# Patient Record
Sex: Male | Born: 1990 | Race: Black or African American | Hispanic: No | Marital: Single | Smoking: Former smoker
Health system: Southern US, Community
[De-identification: ages and names within clinical notes are randomized; demographics above are authoritative.]

## PROBLEM LIST (undated history)

## (undated) DIAGNOSIS — N390 Urinary tract infection, site not specified: Secondary | ICD-10-CM

## (undated) DIAGNOSIS — A749 Chlamydial infection, unspecified: Secondary | ICD-10-CM

---
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
## 2011-05-29 ENCOUNTER — Inpatient Hospital Stay (INDEPENDENT_AMBULATORY_CARE_PROVIDER_SITE_OTHER)
Admission: RE | Admit: 2011-05-29 | Discharge: 2011-05-29 | Disposition: A | Discharge status: Home / Self Care | Source: Ambulatory Visit | Attending: Emergency Medicine | Admitting: Emergency Medicine

## 2011-05-29 DIAGNOSIS — Z0489 Encounter for examination and observation for other specified reasons: Secondary | ICD-10-CM

## 2011-05-29 LAB — HIV ANTIBODY (ROUTINE TESTING W REFLEX): HIV: NONREACTIVE

## 2011-05-30 LAB — GC/CHLAMYDIA PROBE AMP, GENITAL: Chlamydia, DNA Probe: NEGATIVE

## 2011-09-27 ENCOUNTER — Encounter (HOSPITAL_COMMUNITY): Payer: Self-pay | Admitting: *Deleted

## 2011-09-27 ENCOUNTER — Emergency Department (INDEPENDENT_AMBULATORY_CARE_PROVIDER_SITE_OTHER)
Admission: EM | Admit: 2011-09-27 | Discharge: 2011-09-27 | Disposition: A | Discharge status: Home / Self Care | Source: Home / Self Care | Attending: Emergency Medicine | Admitting: Emergency Medicine

## 2011-09-27 DIAGNOSIS — N390 Urinary tract infection, site not specified: Secondary | ICD-10-CM

## 2011-09-27 LAB — POCT URINALYSIS DIP (DEVICE)
Bilirubin Urine: NEGATIVE
Glucose, UA: NEGATIVE mg/dL
Ketones, ur: NEGATIVE mg/dL
Leukocytes, UA: NEGATIVE
pH: 8.5 — ABNORMAL HIGH (ref 5.0–8.0)

## 2011-09-27 MED ORDER — CEPHALEXIN 500 MG PO CAPS: 500.0000 mg | ORAL_CAPSULE | Freq: Three times a day (TID) | ORAL | Status: AC

## 2011-09-27 NOTE — ED Provider Notes (Signed)
History     CSN: [id number] Arrival date & time: 09/27/2011  3:43 PM   First MD Initiated Contact with Patient 09/27/11 1609      Chief Complaint  Patient presents with  . Urinary Tract Infection    (Consider location/radiation/quality/duration/timing/severity/associated sxs/prior treatment) HPI Comments: Tyler Santiago has had a four-day history of burning with urination, urinary frequency, urgency, and small amounts of blood in the urine. He denies any discharge or lesions on the penis. He's had no fever, chills, or sweats. He denies any lower back pain, abdominal pain, nausea, or vomiting. He has no prior history of urinary tract infections. He has no history of STDs in the past. He is sexually active with a single male sexual partner and their last intercourse was a week ago. He states usually uses condoms but on this one occasion he did not. His girlfriend doesn't have any symptoms or any history of any STDs. He was here last August and had STD testing done. He states he did not have any symptoms at that time. All the tests were negative.  Patient is a 20 y.o. male presenting with urinary tract infection.  Urinary Tract Infection Pertinent negatives include no abdominal pain.    History reviewed. No pertinent past medical history.  History reviewed. No pertinent past surgical history.  History reviewed. No pertinent family history.  History  Substance Use Topics  . Smoking status: Not on file  . Smokeless tobacco: Not on file  . Alcohol Use: Not on file      Review of Systems  Constitutional: Negative for fever and chills.  Gastrointestinal: Negative for nausea, vomiting and abdominal pain.  Genitourinary: Positive for dysuria, urgency and frequency. Negative for hematuria, flank pain, discharge, penile swelling, scrotal swelling, genital sores, penile pain and testicular pain.    Allergies  Review of patient's allergies indicates no known allergies.  Home Medications    Current Outpatient Rx  Name Route Sig Dispense Refill  . CEPHALEXIN 500 MG PO CAPS Oral Take 1 capsule (500 mg total) by mouth 3 (three) times daily. 30 capsule 0    BP 124/71  Pulse 62  Temp(Src) 98.1 F (36.7 C) (Oral)  Resp 18  SpO2 98%  Physical Exam  Nursing note and vitals reviewed. Constitutional: He appears well-developed and well-nourished. No distress.  Cardiovascular: Normal rate and regular rhythm.  Exam reveals no gallop and no friction rub.   No murmur heard. Pulmonary/Chest: Effort normal. No respiratory distress. He has no wheezes. He has no rales.  Abdominal: Soft. Bowel sounds are normal. He exhibits no distension and no mass. There is no hepatosplenomegaly. There is no tenderness. There is no rebound, no guarding and no CVA tenderness.  Genitourinary: Penis normal. No penile tenderness.       Genital exam was negative. There was no urethral discharge, no lesions on the penis. Testes are normal. No inguinal adenopathy, no hernia.  Skin: He is not diaphoretic.    ED Course  Procedures (including critical care time)  Results for orders placed during the hospital encounter of 09/27/11  POCT URINALYSIS DIP (DEVICE)      Component Value Range   Glucose, UA NEGATIVE  NEGATIVE (mg/dL)   Bilirubin Urine NEGATIVE  NEGATIVE    Ketones, ur NEGATIVE  NEGATIVE (mg/dL)   Specific Gravity, Urine 1.020  1.005 - 1.030    Hgb urine dipstick TRACE (*) NEGATIVE    pH 8.5 (*) 5.0 - 8.0    Protein, ur 100 (*) NEGATIVE (  mg/dL)   Urobilinogen, UA 1.0  0.0 - 1.0 (mg/dL)   Nitrite POSITIVE (*) NEGATIVE    Leukocytes, UA NEGATIVE  NEGATIVE      Labs Reviewed  POCT URINALYSIS DIP (DEVICE) - Abnormal; Notable for the following:    Hgb urine dipstick TRACE (*)    pH 8.5 (*)    Protein, ur 100 (*)    Nitrite POSITIVE (*)    All other components within normal limits  POCT URINALYSIS DIPSTICK  GC/CHLAMYDIA PROBE AMP, GENITAL  URINE CULTURE   No results found.   1. UTI  (lower urinary tract infection)       MDM  He appears to had a urinary tract infection which is not all that common in males. Right now there isn't any sign of the urethritis. We have cultures pending of his urine and a urethral GC chlamydia probe. Will go ahead and start him on cephalexin and I told him we would call him back if anything other than a routine urinary tract infection showed up.        Roque Lias, MD 09/27/11 1726

## 2011-09-27 NOTE — ED Notes (Signed)
Pt c/o burning upon urination x 3 days. Reports taking AZO & drinking cranberry juice w/ minimal relief. Admits to recently engaging in unprotected sex w/ his partner.

## 2011-09-29 LAB — URINE CULTURE: Culture: NO GROWTH

## 2011-10-01 ENCOUNTER — Telehealth (HOSPITAL_COMMUNITY): Payer: Self-pay | Admitting: Emergency Medicine

## 2011-10-01 LAB — GC/CHLAMYDIA PROBE AMP, GENITAL: Chlamydia, DNA Probe: POSITIVE — AB

## 2011-10-01 MED ORDER — AZITHROMYCIN 250 MG PO TABS: 1000.0000 mg | ORAL_TABLET | Freq: Once | ORAL | Status: AC

## 2011-10-01 NOTE — ED Notes (Signed)
Labs and medications reviewed.  Needs tx. for Chlamydia. Message sent to Dr. Ladon Applebaum and order for Zithromax e- prescribed to [facility] [address].  DHHS form completed and faxed to the [facility]. The patient was notified- see telephone note. Vassie Moselle 10/01/2011

## 2012-03-13 ENCOUNTER — Emergency Department (INDEPENDENT_AMBULATORY_CARE_PROVIDER_SITE_OTHER)
Admission: EM | Admit: 2012-03-13 | Discharge: 2012-03-13 | Disposition: A | Discharge status: Home / Self Care | Payer: Self-pay | Source: Home / Self Care | Attending: Emergency Medicine | Admitting: Emergency Medicine

## 2012-03-13 ENCOUNTER — Encounter (HOSPITAL_COMMUNITY): Payer: Self-pay | Admitting: Emergency Medicine

## 2012-03-13 DIAGNOSIS — R1013 Epigastric pain: Secondary | ICD-10-CM

## 2012-03-13 HISTORY — DX: Chlamydial infection, unspecified: A74.9

## 2012-03-13 HISTORY — DX: Urinary tract infection, site not specified: N39.0

## 2012-03-13 LAB — POCT URINALYSIS DIP (DEVICE)
Ketones, ur: NEGATIVE mg/dL
Protein, ur: 30 mg/dL — AB
Specific Gravity, Urine: 1.015 (ref 1.005–1.030)
pH: 7.5 (ref 5.0–8.0)

## 2012-03-13 MED ORDER — GI COCKTAIL ~~LOC~~
30.0000 mL | Freq: Once | ORAL | Status: AC
  Administered 2012-03-13: 30 mL via ORAL

## 2012-03-13 MED ORDER — DICYCLOMINE HCL 20 MG PO TABS: 20.0000 mg | ORAL_TABLET | Freq: Four times a day (QID) | ORAL | Status: DC | PRN

## 2012-03-13 MED ORDER — GI COCKTAIL ~~LOC~~
ORAL | Status: AC
  Filled 2012-03-13: qty 30

## 2012-03-13 MED ORDER — ONDANSETRON 4 MG PO TBDP: 4.0000 mg | ORAL_TABLET | Freq: Once | ORAL | Status: DC

## 2012-03-13 MED ORDER — ONDANSETRON 8 MG PO TBDP: ORAL_TABLET | ORAL | Status: AC

## 2012-03-13 NOTE — Discharge Instructions (Signed)
Clear liquid diet until you feel better. Return here tomorrow if you're not feeling better for a repeat abdominal exam. Return to the ER immediately if your pain changes in character, quality, location, intensity, if you have persistent vomiting even with Zofran, blood in your stool, multiple episodes of diarrhea, or any other concerns.  Go to www.goodrx.com to look up your medications. This will give you a list of where you can find your prescriptions at the most affordable prices.   Call [facility]  [phone]  If you have no primary doctor, here are some resources that may be helpful:  Medicaid-accepting [facility] Providers:   - [facility]- [address]      [phone]      Mon-Fri 9am-7pm, Sat 9am-1pm   - [facility]- [address] [place], [place]      [phone]   - [facility]- [address] [place]      [phone]   [facility]- [address]      [phone]   - Renaye Rakers- [address] [place], [address]      [phone]      Only accepts [place] patients       after they have her name applied to their card   Self Pay (no insurance) in [place]:   - Sickle Cell Patients: Dr Willey Blade, [facility]      [address] [place]      [phone]   - [phone]   - Physician Referral Service- [phone]   - [facility]- [address] [place]      [phone]   [facility] [place]- [address]   - [facility]- see information above      (Speak to [employer] if you do not have insurance)   - [facility]- [address] [place]      [phone]   - [facility] [place]- [address]      [phone]   - [facility]- [address]      [phone]   - Dr Julio Sicks-  [address], [place]      [phone]   - [facility]- [address]      [phone]   - [facility]- [address]      [phone]      Also [address]      [phone]   - [facility]- [address]      [phone]      1st and 3rd Saturday every month, 10am-1pm    Other agencies that provide inexpensive medical care:     [facility]  [phone]    [facility]  [phone]    [facility]  [phone]    [facility]  [phone] [address] [place] [address]    [facility]  [phone]    [facility]  [phone] [facility] [phone]   [address] [place], [place] [address]  Chronic Pain Problems Contact [facility]  [phone] Patients need to be referred by their primary care doctor.  [facility] [place]  [facility] Dept. [address] [place]                       [address]      [address] [place] [address]   [phone] (After Hours)  General Information: Finding a doctor when you do not have health insurance can be tricky. Although you are not limited by an insurance plan, you are of course limited by her finances and how much but he can pay out of pocket.  What are your options if you don't have health insurance?   1) Find a Librarian, academic and Pay Out of Pocket Although you won't have to find out who is covered by your insurance plan, it is a good idea to ask around and get recommendations. You will then need to call the office and see if the doctor you have chosen will accept you as a new patient and what types of options they offer for patients who are self-pay. Some doctors offer discounts or will set up payment plans for their patients who do not have insurance, but you will need to ask so you aren't surprised when you get to your appointment.  2) Contact Your Local Health  Department Not all health departments have doctors that can see patients for sick visits, but many do, so it is worth a call to see if yours does. If you don't know where your local health department is, you can check in your phone book. The CDC also has a tool to help you locate your state's health department, and many state websites also have listings of all of their local health departments.  3) Find a Walk-in Clinic If your illness is not likely to be very severe or complicated, you may want to try a walk in clinic. These are popping up all over the country in pharmacies, drugstores, and shopping centers. They're usually staffed by nurse practitioners or physician assistants that have been trained to treat common illnesses and complaints. They're usually fairly quick and inexpensive. However, if you have serious medical issues or chronic medical problems, these are probably not your best option

## 2012-03-13 NOTE — ED Notes (Signed)
Pt having severe stomach pain starting this morning, he states it feels like it is twisting. Pt states he vomited one time today. No nausea or vomiting. Pt states he had a big mac and LOTS of popcorn last night.

## 2012-03-13 NOTE — ED Notes (Signed)
Patient could not void at time of order placement

## 2012-03-13 NOTE — ED Provider Notes (Signed)
History     CSN: [id number]  Arrival date & time 03/13/12  1136   First MD Initiated Contact with Patient 03/13/12 1204      Chief Complaint  Patient presents with  . Abdominal Pain    (Consider location/radiation/quality/duration/timing/severity/associated sxs/prior treatment) HPI Comments: Patient reports eating a big Mac and a large tub of popcorn last night. Denies alcohol intake. Reports sharp, "twisting" periumbilical abdominal pain that lasts less than a minute and then resolves starting 12 hours later. States he rarely eats greasy food. No right upper quadrant pain. Vomited once. States he felt better after vomiting. No fevers, abdominal distention, hematochezia, melena. last bowel movement was yesterday, was within normal limits for him. No urinary complaints. No penile discharge. Patient is sexually active with male partner who is asymptomatic. No  history of  appendicitis, abdominal surgeries, pancreatitis, nephrolithiasis.  ROS as noted in HPI. All other ROS negative.   Patient is a 21 y.o. male presenting with abdominal pain. The history is provided by the patient. No language interpreter was used.  Abdominal Pain The primary symptoms of the illness include abdominal pain, nausea and vomiting. The primary symptoms of the illness do not include fever, diarrhea, hematemesis, hematochezia or dysuria. The current episode started 3 to 5 hours ago. The onset of the illness was sudden. The problem has been gradually improving.  The patient has not had a change in bowel habit. Symptoms associated with the illness do not include chills, anorexia, constipation, urgency, hematuria, frequency or back pain. Significant associated medical issues do not include PUD, inflammatory bowel disease, diabetes, gallstones, liver disease, substance abuse or diverticulitis.    History reviewed. No pertinent past medical history.  History reviewed. No pertinent past surgical history.  History  reviewed. No pertinent family history.  History  Substance Use Topics  . Smoking status: Not on file  . Smokeless tobacco: Not on file  . Alcohol Use: Not on file      Review of Systems  Constitutional: Negative for fever and chills.  Gastrointestinal: Positive for nausea, vomiting and abdominal pain. Negative for diarrhea, constipation, hematochezia, anorexia and hematemesis.  Genitourinary: Negative for dysuria, urgency, frequency and hematuria.  Musculoskeletal: Negative for back pain.    Allergies  Review of patient's allergies indicates no known allergies.  Home Medications  No current outpatient prescriptions on file.  BP 127/82  Pulse 75  Temp(Src) 98.1 F (36.7 C) (Oral)  Resp 18  SpO2 100%  Physical Exam  Nursing note and vitals reviewed. Constitutional: He is oriented to person, place, and time. He appears well-developed and well-nourished.  HENT:  Head: Normocephalic and atraumatic.  Eyes: Conjunctivae and EOM are normal.  Neck: Normal range of motion.  Cardiovascular: Normal rate, regular rhythm and normal heart sounds.   Pulmonary/Chest: Effort normal and breath sounds normal. No respiratory distress.  Abdominal: Soft. Normal appearance and bowel sounds are normal. He exhibits no distension and no mass. There is tenderness in the periumbilical area. There is no rigidity, no rebound, no guarding, no CVA tenderness, no tenderness at McBurney's point and negative Murphy's sign.       Mild periumbilical tenderness. No right upper quadrant tenderness.  Musculoskeletal: Normal range of motion. He exhibits no edema.  Neurological: He is alert and oriented to person, place, and time.  Skin: Skin is warm and dry. No rash noted.  Psychiatric: He has a normal mood and affect. His behavior is normal. Judgment and thought content normal.    ED Course  Procedures (including critical care time)  Labs Reviewed  POCT URINALYSIS DIP (DEVICE) - Abnormal; Notable for the  following:    Hgb urine dipstick TRACE (*)    Protein, ur 30 (*)    All other components within normal limits   No results found.   No diagnosis found.  Results for orders placed during the hospital encounter of 03/13/12  POCT URINALYSIS DIP (DEVICE)      Component Value Range   Glucose, UA NEGATIVE  NEGATIVE (mg/dL)   Bilirubin Urine NEGATIVE  NEGATIVE    Ketones, ur NEGATIVE  NEGATIVE (mg/dL)   Specific Gravity, Urine 1.015  1.005 - 1.030    Hgb urine dipstick TRACE (*) NEGATIVE    pH 7.5  5.0 - 8.0    Protein, ur 30 (*) NEGATIVE (mg/dL)   Urobilinogen, UA 0.2  0.0 - 1.0 (mg/dL)   Nitrite NEGATIVE  NEGATIVE    Leukocytes, UA NEGATIVE  NEGATIVE      MDM  Previous records reviewed. History of UTI, Chlamydia.    Pt abd exam is benign, no peritoneal signs. Mild periumbilical tenderness. Denies alcohol intake. Appears comfortable, tolerating by mouth here. Symptoms better after GI cocktail. Tolerating by mouth. Even if he has pancreatitis, he does not appear to be in a significant amount of pain or dehydrated at this time. No evidence of surgical abd. Doubt SBO, mesenteric ischemia, appendicitis, hepatitis, cholecystitis, pancreatitis, or perforated viscus. No evidence to suggest testicular source for abdominal pain.  Gave very specific return instructions. Will have him return here to our for repeat abdominal exam if he is not feeling significantly better. Home with Bentyl and Zofran. Also discussed hematuria. No urinary complaints today. He will need to have this  followup with a primary care physician of his choice. Providing list of resources.  Luiz Blare, MD 03/13/12 [phone]

## 2012-05-08 ENCOUNTER — Emergency Department (INDEPENDENT_AMBULATORY_CARE_PROVIDER_SITE_OTHER)
Admission: EM | Admit: 2012-05-08 | Discharge: 2012-05-08 | Disposition: A | Discharge status: Home / Self Care | Source: Home / Self Care | Attending: Emergency Medicine | Admitting: Emergency Medicine

## 2012-05-08 ENCOUNTER — Encounter (HOSPITAL_COMMUNITY): Payer: Self-pay | Admitting: *Deleted

## 2012-05-08 DIAGNOSIS — N342 Other urethritis: Secondary | ICD-10-CM

## 2012-05-08 LAB — POCT URINALYSIS DIP (DEVICE)
Protein, ur: NEGATIVE mg/dL
Urobilinogen, UA: 0.2 mg/dL (ref 0.0–1.0)
pH: 6 (ref 5.0–8.0)

## 2012-05-08 MED ORDER — AZITHROMYCIN 250 MG PO TABS
1000.0000 mg | ORAL_TABLET | Freq: Once | ORAL | Status: AC
  Administered 2012-05-08: 1000 mg via ORAL

## 2012-05-08 MED ORDER — CEPHALEXIN 500 MG PO CAPS: 500.0000 mg | ORAL_CAPSULE | Freq: Three times a day (TID) | ORAL | Status: AC

## 2012-05-08 MED ORDER — AZITHROMYCIN 250 MG PO TABS
ORAL_TABLET | ORAL | Status: AC
  Filled 2012-05-08: qty 4

## 2012-05-08 MED ORDER — CEFTRIAXONE SODIUM 250 MG IJ SOLR
250.0000 mg | Freq: Once | INTRAMUSCULAR | Status: AC
  Administered 2012-05-08: 250 mg via INTRAMUSCULAR

## 2012-05-08 MED ORDER — CEFTRIAXONE SODIUM 250 MG IJ SOLR
INTRAMUSCULAR | Status: AC
  Filled 2012-05-08: qty 250

## 2012-05-08 NOTE — ED Notes (Signed)
Pt  Reports  Symptoms  Of     Burning on  Urination        -     Pt  States           He  May  Have  An std         He  denys  Any   Other     Symptoms  Other than the  Aforementioned

## 2012-05-08 NOTE — ED Provider Notes (Addendum)
Chief Complaint  Patient presents with  . SEXUALLY TRANSMITTED DISEASE    History of Present Illness:   Tyler Santiago is a 21 year old male who has had a one-day history of burning with urination. He denies any discharge, frequency, urgency, or hematuria. He has had no lesions on the penis, no fever, chills, skin rash, joint pain, adenopathy, or testicular pain. He notes that his urinary stream is normal. He has had a prior history of Chlamydia.  Review of Systems:  Other than noted above, the patient denies any of the following symptoms: General:  No fevers, chills, sweats, aches, or fatigue. GI:  No abdominal pain, back pain, nausea, vomiting, diarrhea, or constipation. GU:  No dysuria, frequency, urgency, hematuria, urethral discharge, penile lesions, penile pain, testicular pain, swelling, or mass, inguinal lymphadenopathy or incontinence.  PMFSH:  Past medical history, family history, social history, meds, and allergies were reviewed.  Physical Exam:   Vital signs:  BP 143/77  Pulse 60  Temp 98.6 F (37 C) (Oral)  Resp 16  SpO2 100% Gen:  Alert, oriented, in no distress. Lungs:  Clear to auscultation, no wheezes, rales or rhonchi. Heart:  Regular rhythm, no gallop or murmer. Abdomen:  Flat and soft.  No tenderness to palpation, guarding, or rebound.  No hepato-splenomegaly or mass.  Bowel sounds were normally active.  No hernia. Genital exam:   normal genital exam. No urethral discharge, no lesions on the penis , no inguinal adenopathy, testes are normal without tenderness or masses. Back:  No CVA tenderness.  Skin:  Clear, warm and dry.  Labs:   Results for orders placed during the hospital encounter of 05/08/12  POCT URINALYSIS DIP (DEVICE)      Component Value Range   Glucose, UA NEGATIVE  NEGATIVE mg/dL   Bilirubin Urine NEGATIVE  NEGATIVE   Ketones, ur NEGATIVE  NEGATIVE mg/dL   Specific Gravity, Urine 1.025  1.005 - 1.030   Hgb urine dipstick SMALL (*) NEGATIVE   pH 6.0  5.0 -  8.0   Protein, ur NEGATIVE  NEGATIVE mg/dL   Urobilinogen, UA 0.2  0.0 - 1.0 mg/dL   Nitrite NEGATIVE  NEGATIVE   Leukocytes, UA TRACE (*) NEGATIVE    Other Labs Obtained at Urgent Care Center:  Urine culture was obtained as well as GC and Chlamydia DNA probe.  Results are pending at this time and we will call about any positive results.  Course in Urgent Care Center:   He was given Rocephin 250 mg IM and azithromycin 1000 mg by mouth and tolerated these well without any immediate side effects.  Assessment: The encounter diagnosis was Urethritis.  I think it's most likely that he has urethritis, but he did not have any discharge. I did go ahead and culture his urine and will treat with Rocephin and azithromycin. Cultures are pending.   Plan:   1.  The following meds were prescribed:   New Prescriptions   CEPHALEXIN (KEFLEX) 500 MG CAPSULE    Take 1 capsule (500 mg total) by mouth 3 (three) times daily.   2.  The patient was instructed in symptomatic care and handouts were given. 3.  The patient was told to return if becoming worse in any way, if no better in 3 or 4 days, and given some red flag symptoms that would indicate earlier return.     Reuben Likes, MD 05/08/12 2013   Patient still has urethral burning.  His DNA probe showed chlamydia, but he was treated.  His urine culture was negative.  He has not started his cephalexin.  Will treat with doxycycline 100 mg BID for 10 days and Metronidazole 500 mg BID for 1 week.  Reuben Likes, MD 05/12/12 [phone]

## 2012-05-09 LAB — URINE CULTURE
Colony Count: NO GROWTH
Culture: NO GROWTH

## 2012-05-11 LAB — GC/CHLAMYDIA PROBE AMP, GENITAL
Chlamydia, DNA Probe: POSITIVE — AB
GC Probe Amp, Genital: NEGATIVE

## 2012-05-12 ENCOUNTER — Telehealth (HOSPITAL_COMMUNITY): Payer: Self-pay | Admitting: *Deleted

## 2012-05-12 MED ORDER — DOXYCYCLINE HYCLATE 100 MG PO TABS: 100.0000 mg | ORAL_TABLET | Freq: Two times a day (BID) | ORAL | Status: AC

## 2012-05-12 MED ORDER — METRONIDAZOLE 500 MG PO TABS: 500.0000 mg | ORAL_TABLET | Freq: Two times a day (BID) | ORAL | Status: AC

## 2012-05-12 NOTE — ED Notes (Signed)
Pt. came to [facility] with questions about tx. I explained to pt. that the doctor had prescribed Doxycycline and Flagyl because he was still complaining about burning.  He said he was told the Zithromax he took here was for a urinary tract infection. I told him the Zithromax he took was for the Chlamydia. I told him his urine culture was negative. He does not have a UTI.  I said he may have a urethritis and there are many causes for this, STD's, UTI's non-gonococcal urethritis.  He is giving the Flagyl in case it is trich, which he was not tested for but can give these symptoms. I explained this 3 times, 3 ways and pt. Does not understand what I am saying. He asked to speak to the doctor. I asked Dr. Lorenz Coaster to speak to him and he did. Vassie Moselle 05/12/2012

## 2012-05-12 NOTE — ED Notes (Signed)
Pt. called in for his lab results. I told him I tried to call him earlier today x 2.  Pt. verified x 2 and given results.  Pt. told he was adequately treated with the Zithromax.  Pt. instructed to notify his partner, no sex for 1 week and to practice safe sex. Pt. told he can get HIV testing at the [facility]. STD clinic.  Pt. states he is still having burning. I told him it has been 4 days and he should have gotten some relief. Pt. denies discharge. Pt. states he did not get the Rx. of Keflex. I told him the urine culture was neg., so it is not a UTI. I told him I would ask Dr. Lorenz Coaster.  Dr. Lorenz Coaster thinks he would benefit from Doxycycline and Flagyl. He will e- prescribe them to his pharmacy.  When I got back to the phone, pt. had hung up.  I called pt. back and explained this to him.   Pt. instructed to no alcohol while taking the Flagyl.  Pt. Instructed to take medication with food because they could make him nauseated. Pt. wants Rx. sent to the [facility] on [address]. He asked me to call when the have been sent. Vassie Moselle 05/12/2012

## 2012-05-12 NOTE — ED Notes (Signed)
GC neg., Chlamydia pos.  Pt. adequately treated with Zithromax.  I called pt. but he was unavailable- unable to leave a message. I called his home # and it was not in service.  DHHS form completed and faxed to the [facility]. Vassie Moselle 05/12/2012

## 2012-10-09 ENCOUNTER — Emergency Department (HOSPITAL_BASED_OUTPATIENT_CLINIC_OR_DEPARTMENT_OTHER)

## 2012-10-09 ENCOUNTER — Encounter (HOSPITAL_BASED_OUTPATIENT_CLINIC_OR_DEPARTMENT_OTHER): Payer: Self-pay | Admitting: *Deleted

## 2012-10-09 ENCOUNTER — Emergency Department (HOSPITAL_BASED_OUTPATIENT_CLINIC_OR_DEPARTMENT_OTHER)
Admission: EM | Admit: 2012-10-09 | Discharge: 2012-10-09 | Disposition: A | Discharge status: Home / Self Care | Attending: Emergency Medicine | Admitting: Emergency Medicine

## 2012-10-09 DIAGNOSIS — S022XXA Fracture of nasal bones, initial encounter for closed fracture: Secondary | ICD-10-CM

## 2012-10-09 DIAGNOSIS — F17200 Nicotine dependence, unspecified, uncomplicated: Secondary | ICD-10-CM | POA: Insufficient documentation

## 2012-10-09 DIAGNOSIS — S0003XA Contusion of scalp, initial encounter: Secondary | ICD-10-CM | POA: Insufficient documentation

## 2012-10-09 DIAGNOSIS — Z8619 Personal history of other infectious and parasitic diseases: Secondary | ICD-10-CM | POA: Insufficient documentation

## 2012-10-09 DIAGNOSIS — T148XXA Other injury of unspecified body region, initial encounter: Secondary | ICD-10-CM

## 2012-10-09 DIAGNOSIS — N390 Urinary tract infection, site not specified: Secondary | ICD-10-CM | POA: Insufficient documentation

## 2012-10-09 DIAGNOSIS — S1093XA Contusion of unspecified part of neck, initial encounter: Secondary | ICD-10-CM | POA: Insufficient documentation

## 2012-10-09 MED ORDER — HYDROCODONE-ACETAMINOPHEN 5-325 MG PO TABS: 1.0000 | ORAL_TABLET | Freq: Four times a day (QID) | ORAL | Status: DC | PRN
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
## 2012-11-11 ENCOUNTER — Emergency Department (INDEPENDENT_AMBULATORY_CARE_PROVIDER_SITE_OTHER)
Admission: EM | Admit: 2012-11-11 | Discharge: 2012-11-11 | Disposition: A | Discharge status: Home / Self Care | Source: Home / Self Care | Attending: Emergency Medicine | Admitting: Emergency Medicine

## 2012-11-11 ENCOUNTER — Encounter (HOSPITAL_COMMUNITY): Payer: Self-pay | Admitting: *Deleted

## 2012-11-11 DIAGNOSIS — Z113 Encounter for screening for infections with a predominantly sexual mode of transmission: Secondary | ICD-10-CM | POA: Insufficient documentation

## 2012-11-11 DIAGNOSIS — Z2089 Contact with and (suspected) exposure to other communicable diseases: Secondary | ICD-10-CM

## 2012-11-11 DIAGNOSIS — Z202 Contact with and (suspected) exposure to infections with a predominantly sexual mode of transmission: Secondary | ICD-10-CM

## 2012-11-11 NOTE — ED Notes (Signed)
Pt request to be checked for std - denies s/s of any std

## 2012-11-13 ENCOUNTER — Other Ambulatory Visit (HOSPITAL_COMMUNITY)
Admission: RE | Admit: 2012-11-13 | Discharge: 2012-11-13 | Disposition: A | Discharge status: Home / Self Care | Payer: Self-pay | Source: Ambulatory Visit | Attending: Family Medicine | Admitting: Family Medicine

## 2012-11-15 MED ORDER — DOXYCYCLINE HYCLATE 100 MG PO CAPS: 100.0000 mg | ORAL_CAPSULE | Freq: Two times a day (BID) | ORAL | Status: DC
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
## 2012-11-16 ENCOUNTER — Telehealth (HOSPITAL_COMMUNITY): Payer: Self-pay | Admitting: *Deleted

## 2012-11-16 NOTE — ED Notes (Signed)
GC neg., Chlamydia pos., HIV/RPR non-reactive.  1/19  Dr. Tressia Danas did Rx. of Doxycycline . I called pt. and left a message to call. Tyler Santiago 11/16/2012

## 2012-11-17 ENCOUNTER — Telehealth (HOSPITAL_COMMUNITY): Payer: Self-pay | Admitting: *Deleted

## 2012-11-17 NOTE — ED Notes (Signed)
Pt. called back. Pt. verified x 2 and given results.  Pt. told he has a Rx. of Zithromax at the [facility] on [address].  Pt. instructed to notify his partner, no sex for 1 week and to practice safe sex. Pt. told he should get HIV rechecked in 6 mos. at the [facility] Dept. STD clinic. Pt. Voiced understanding.  DHHS form completed and faxed to the [facility]. Vassie Moselle 11/17/2012

## 2013-05-11 ENCOUNTER — Other Ambulatory Visit (HOSPITAL_COMMUNITY)
Admission: RE | Admit: 2013-05-11 | Discharge: 2013-05-11 | Disposition: A | Discharge status: Home / Self Care | Payer: Self-pay | Source: Ambulatory Visit | Attending: Emergency Medicine | Admitting: Emergency Medicine

## 2013-05-11 ENCOUNTER — Encounter (HOSPITAL_COMMUNITY): Payer: Self-pay | Admitting: Emergency Medicine

## 2013-05-11 ENCOUNTER — Emergency Department (INDEPENDENT_AMBULATORY_CARE_PROVIDER_SITE_OTHER)
Admission: EM | Admit: 2013-05-11 | Discharge: 2013-05-11 | Disposition: A | Discharge status: Home / Self Care | Payer: Self-pay | Source: Home / Self Care

## 2013-05-11 DIAGNOSIS — N342 Other urethritis: Secondary | ICD-10-CM

## 2013-05-11 DIAGNOSIS — Z113 Encounter for screening for infections with a predominantly sexual mode of transmission: Secondary | ICD-10-CM | POA: Insufficient documentation

## 2013-05-11 LAB — POCT URINALYSIS DIP (DEVICE)
Bilirubin Urine: NEGATIVE
Glucose, UA: NEGATIVE mg/dL
Ketones, ur: NEGATIVE mg/dL
Nitrite: NEGATIVE

## 2013-05-11 MED ORDER — CEFTRIAXONE SODIUM 250 MG IJ SOLR
250.0000 mg | Freq: Once | INTRAMUSCULAR | Status: AC
  Administered 2013-05-11: 250 mg via INTRAMUSCULAR

## 2013-05-11 MED ORDER — CEFTRIAXONE SODIUM 250 MG IJ SOLR
INTRAMUSCULAR | Status: AC
  Filled 2013-05-11: qty 250

## 2013-05-11 MED ORDER — AZITHROMYCIN 250 MG PO TABS
ORAL_TABLET | ORAL | Status: AC
  Filled 2013-05-11: qty 4

## 2013-05-11 MED ORDER — AZITHROMYCIN 250 MG PO TABS
1000.0000 mg | ORAL_TABLET | Freq: Every day | ORAL | Status: DC
  Administered 2013-05-11: 1000 mg via ORAL

## 2013-05-11 MED ORDER — LIDOCAINE HCL (PF) 1 % IJ SOLN
INTRAMUSCULAR | Status: AC
  Filled 2013-05-11: qty 5

## 2013-05-11 NOTE — ED Provider Notes (Signed)
History    CSN: [id number] Arrival date & time 05/11/13  1634  None    Chief Complaint  Patient presents with  . Exposure to STD   (Consider location/radiation/quality/duration/timing/severity/associated sxs/prior Treatment) HPI  22 yo bm presents today with complaint of dysuria x one week.  States that his partner is currently being treated for GC or Chlamydia.  Denies hematuria, penile discharge, fever, chills, abd/testicular pain.  Has been treated for std in the past.   Past Medical History  Diagnosis Date  . UTI (lower urinary tract infection)   . Chlamydia    History reviewed. No pertinent past surgical history. No family history on file. History  Substance Use Topics  . Smoking status: Current Some Day Smoker    Types: Cigars  . Smokeless tobacco: Not on file  . Alcohol Use: No    Review of Systems  Constitutional: Negative.   HENT: Negative.   Eyes: Negative.   Respiratory: Negative.   Cardiovascular: Negative.   Gastrointestinal: Negative.   Endocrine: Negative.   Genitourinary: Positive for dysuria and enuresis. Negative for urgency, frequency, hematuria, flank pain, decreased urine volume, discharge, penile swelling, scrotal swelling, difficulty urinating, genital sores, penile pain and testicular pain.  Musculoskeletal: Negative.   Skin: Negative.   Neurological: Negative.   Hematological: Negative.   Psychiatric/Behavioral: Negative.     Allergies  Review of patient's allergies indicates no known allergies.  Home Medications   Current Outpatient Rx  Name  Route  Sig  Dispense  Refill  . EXPIRED: dicyclomine (BENTYL) 20 MG tablet   Oral   Take 1 tablet (20 mg total) by mouth every 6 (six) hours as needed.   12 tablet   0   . doxycycline (VIBRAMYCIN) 100 MG capsule   Oral   Take 1 capsule (100 mg total) by mouth 2 (two) times daily.   20 capsule   0   . HYDROcodone-acetaminophen (NORCO/VICODIN) 5-325 MG per tablet   Oral   Take 1 tablet by  mouth every 6 (six) hours as needed for pain.   15 tablet   0    BP 132/72  Pulse 60  Temp(Src) 99.4 F (37.4 C) (Oral)  Resp 14  SpO2 98% Physical Exam  Constitutional: He is oriented to person, place, and time. He appears well-developed and well-nourished.  HENT:  Head: Normocephalic and atraumatic.  Eyes: Pupils are equal, round, and reactive to light.  Neck: Normal range of motion.  Cardiovascular: Normal rate and regular rhythm.   Pulmonary/Chest: Effort normal and breath sounds normal.  Abdominal: Soft. He exhibits no distension. There is no tenderness.  Genitourinary: Penis normal. No penile tenderness.  No gross discharge noted.    Musculoskeletal: Normal range of motion.  Neurological: He is alert and oriented to person, place, and time.  Skin: Skin is warm.  Psychiatric: He has a normal mood and affect.    ED Course  Procedures (including critical care time) Labs Reviewed  POCT URINALYSIS DIP (DEVICE) - Abnormal; Notable for the following:    Hgb urine dipstick TRACE (*)    Leukocytes, UA TRACE (*)    All other components within normal limits  HIV ANTIBODY (ROUTINE TESTING)  RPR  HEPATITIS PANEL, ACUTE  URINE CYTOLOGY ANCILLARY ONLY   No results found. No diagnosis found.  MDM  Labs done.  Given rocephin and azithromycin today.  Will call him if results are abnormal.  Return to clinic if symptoms worsen or go to ED.  Voices  understanding.   Meds ordered this encounter  Medications  . DISCONTD: azithromycin (ZITHROMAX) tablet 1,000 mg    Sig:   . cefTRIAXone (ROCEPHIN) injection 250 mg    Sig:     Zonia Kief, PA-C 05/12/13 [phone]

## 2013-05-11 NOTE — ED Notes (Signed)
Call back number verified.  
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
## 2013-05-12 LAB — HIV ANTIBODY (ROUTINE TESTING W REFLEX): HIV: NONREACTIVE

## 2013-05-13 ENCOUNTER — Telehealth (HOSPITAL_COMMUNITY): Payer: Self-pay | Admitting: *Deleted

## 2013-05-13 LAB — HEPATITIS PANEL, ACUTE
HCV Ab: NEGATIVE
Hep A IgM: NEGATIVE
Hep B C IgM: NEGATIVE

## 2013-05-13 NOTE — ED Notes (Signed)
GC and Chlamydia pos., HIV/RPR non-reactive, Acute Hepatitis Panel: all neg.  Pt. adequately treated with Rocephin and Zithromax.  I called pt. and left a message to call.  DHHS forms x 2 completed and faxed to the [facility]. Vassie Moselle 05/13/2013

## 2013-05-14 ENCOUNTER — Telehealth (HOSPITAL_COMMUNITY): Payer: Self-pay | Admitting: *Deleted

## 2013-05-14 NOTE — ED Notes (Signed)
Pt. called back. Pt. verified x 2 and given results.  Pt. told he was adequately treated with Rocephin and Zithromax.  Pt. instructed to notify his partner, no sex for 1 week and to practice safe sex. Pt. told he should get his HIV rechecked in 6 mos. at the [facility] Dept. STD clinic, by appointment.  Pt. voiced understanding. Vassie Moselle 05/14/2013

## 2013-05-14 NOTE — ED Provider Notes (Signed)
Medical screening examination/treatment/procedure(s) were performed by non-physician practitioner and as supervising physician I was immediately available for consultation/collaboration.  Leslee Home, M.D.  Reuben Likes, MD 05/14/13 [phone]
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
## 2014-10-18 ENCOUNTER — Encounter (HOSPITAL_COMMUNITY): Payer: Self-pay | Admitting: Emergency Medicine

## 2014-10-18 ENCOUNTER — Other Ambulatory Visit (HOSPITAL_COMMUNITY)
Admission: RE | Admit: 2014-10-18 | Discharge: 2014-10-18 | Disposition: A | Discharge status: Home / Self Care | Payer: Self-pay | Source: Ambulatory Visit | Attending: Family Medicine | Admitting: Family Medicine

## 2014-10-18 ENCOUNTER — Emergency Department (INDEPENDENT_AMBULATORY_CARE_PROVIDER_SITE_OTHER)
Admission: EM | Admit: 2014-10-18 | Discharge: 2014-10-18 | Disposition: A | Discharge status: Home / Self Care | Payer: Self-pay | Source: Home / Self Care | Attending: Family Medicine | Admitting: Family Medicine

## 2014-10-18 DIAGNOSIS — Z113 Encounter for screening for infections with a predominantly sexual mode of transmission: Secondary | ICD-10-CM | POA: Insufficient documentation

## 2014-10-18 DIAGNOSIS — Z202 Contact with and (suspected) exposure to infections with a predominantly sexual mode of transmission: Secondary | ICD-10-CM

## 2014-10-18 MED ORDER — CEFTRIAXONE SODIUM 250 MG IJ SOLR
INTRAMUSCULAR | Status: AC
  Filled 2014-10-18: qty 250

## 2014-10-18 MED ORDER — LIDOCAINE HCL (PF) 1 % IJ SOLN
INTRAMUSCULAR | Status: AC
  Filled 2014-10-18: qty 5

## 2014-10-18 MED ORDER — AZITHROMYCIN 250 MG PO TABS
1000.0000 mg | ORAL_TABLET | Freq: Once | ORAL | Status: AC
  Administered 2014-10-18: 1000 mg via ORAL

## 2014-10-18 MED ORDER — CEFTRIAXONE SODIUM 250 MG IJ SOLR
250.0000 mg | Freq: Once | INTRAMUSCULAR | Status: AC
  Administered 2014-10-18: 250 mg via INTRAMUSCULAR

## 2014-10-18 MED ORDER — AZITHROMYCIN 250 MG PO TABS
ORAL_TABLET | ORAL | Status: AC
  Filled 2014-10-18: qty 4

## 2014-10-18 NOTE — ED Notes (Signed)
Pt states that he has had burning while urinating for a week. He states that he may have been exposed to a STD

## 2014-10-18 NOTE — Discharge Instructions (Signed)
We will call with positive test results and treat as indicated  °

## 2014-10-18 NOTE — ED Provider Notes (Addendum)
CSN: [id number]     Arrival date & time 10/18/14  1651 History   First MD Initiated Contact with Patient 10/18/14 1732     Chief Complaint  Patient presents with  . Exposure to STD   (Consider location/radiation/quality/duration/timing/severity/associated sxs/prior Treatment) Patient is a 23 y.o. male presenting with STD exposure. The history is provided by the patient.  Exposure to STD This is a new problem. The current episode started more than 1 week ago. The problem has not changed since onset.Pertinent negatives include no chest pain and no abdominal pain.    Past Medical History  Diagnosis Date  . UTI (lower urinary tract infection)   . Chlamydia    History reviewed. No pertinent past surgical history. History reviewed. No pertinent family history. History  Substance Use Topics  . Smoking status: Current Some Day Smoker    Types: Cigars  . Smokeless tobacco: Not on file  . Alcohol Use: No    Review of Systems  Constitutional: Negative.   Cardiovascular: Negative for chest pain.  Gastrointestinal: Negative for abdominal pain.  Genitourinary: Positive for dysuria and frequency. Negative for urgency, discharge, penile swelling, penile pain and testicular pain.    Allergies  Review of patient's allergies indicates no known allergies.  Home Medications   Prior to Admission medications   Medication Sig Start Date End Date Taking? Authorizing Provider  dicyclomine (BENTYL) 20 MG tablet Take 1 tablet (20 mg total) by mouth every 6 (six) hours as needed. 03/13/12 03/13/13  Domenick GongAshley Mortenson, MD  doxycycline (VIBRAMYCIN) 100 MG capsule Take 1 capsule (100 mg total) by mouth 2 (two) times daily. 11/15/12   Adlih Moreno-Coll, MD  HYDROcodone-acetaminophen (NORCO/VICODIN) 5-325 MG per tablet Take 1 tablet by mouth every 6 (six) hours as needed for pain. 10/09/12   Gwyneth SproutWhitney Plunkett, MD   BP 127/74 mmHg  Pulse 60  Temp(Src) 98.4 F (36.9 C) (Oral)  Resp 12  SpO2 100% Physical  Exam  Constitutional: He is oriented to person, place, and time. He appears well-developed and well-nourished.  Abdominal: Soft. Bowel sounds are normal. There is no tenderness. Hernia confirmed negative in the right inguinal area and confirmed negative in the left inguinal area.  Genitourinary: Testes normal and penis normal. Right testis shows no tenderness. Left testis shows no tenderness. Circumcised.  Lymphadenopathy:       Right: No inguinal adenopathy present.       Left: No inguinal adenopathy present.  Neurological: He is alert and oriented to person, place, and time.  Skin: Skin is warm and dry.  Nursing note and vitals reviewed.   ED Course  Procedures (including critical care time) Labs Review Labs Reviewed  CYTOLOGY, (ORAL, ANAL, URETHRAL) ANCILLARY ONLY    Imaging Review No results found.   MDM   1. Possible exposure to STD        Linna HoffJames D Webster Patrone, MD 10/18/14 1754  Linna HoffJames D Talin Rozeboom, MD 10/18/14 [phone]

## 2014-10-20 LAB — URINE CYTOLOGY ANCILLARY ONLY
Chlamydia: NEGATIVE
Neisseria Gonorrhea: NEGATIVE
Trichomonas: NEGATIVE

## 2014-10-26 ENCOUNTER — Telehealth (HOSPITAL_COMMUNITY): Payer: Self-pay | Admitting: *Deleted

## 2014-10-26 NOTE — ED Notes (Signed)
Pt. called for his lab results on VM. I called pt. back.  Pt. verified x 2 and given neg. results.  He asked if that was all STD's.  I told him we did not check for HIV or RPR.  I told him [facility]. Vassie MoselleYork, Norah Devin M 10/26/2014

## 2015-08-23 ENCOUNTER — Emergency Department (INDEPENDENT_AMBULATORY_CARE_PROVIDER_SITE_OTHER)
Admission: EM | Admit: 2015-08-23 | Discharge: 2015-08-23 | Disposition: A | Discharge status: Home / Self Care | Payer: Self-pay | Source: Home / Self Care | Attending: Emergency Medicine | Admitting: Emergency Medicine

## 2015-08-23 ENCOUNTER — Encounter (HOSPITAL_COMMUNITY): Payer: Self-pay | Admitting: Emergency Medicine

## 2015-08-23 DIAGNOSIS — L299 Pruritus, unspecified: Secondary | ICD-10-CM

## 2015-08-23 MED ORDER — HYDROXYZINE HCL 25 MG PO TABS: 25.0000 mg | ORAL_TABLET | Freq: Four times a day (QID) | ORAL | Status: AC | PRN

## 2015-08-23 MED ORDER — TRIAMCINOLONE ACETONIDE 0.1 % EX CREA: 1.0000 "application " | TOPICAL_CREAM | Freq: Two times a day (BID) | CUTANEOUS | Status: AC | PRN

## 2015-08-23 NOTE — ED Provider Notes (Signed)
CSN: [id number]     Arrival date & time 08/23/15  1349 History   First MD Initiated Contact with Patient 08/23/15 1502     Chief Complaint  Patient presents with  . Rash   (Consider location/radiation/quality/duration/timing/severity/associated sxs/prior Treatment) HPI  He is a 24 year old man here for evaluation of itching. He states he moved into a new house about a month ago. In the last week or 2, he has had itching of his lower extremities. Occasionally he will have itching of his lower back. He states sometimes there are little bumps, but not all the time. He states it feels like something is biting him. He is exposed to several cats and dog. They did see one bug in the home, but he is not sure what it was. They have concrete floors, but there are cracks in the floors. He denies any new detergents or products, but states when he was younger he had very sensitive skin.  Past Medical History  Diagnosis Date  . UTI (lower urinary tract infection)   . Chlamydia    History reviewed. No pertinent past surgical history. No family history on file. Social History  Substance Use Topics  . Smoking status: Current Some Day Smoker    Types: Cigars  . Smokeless tobacco: None  . Alcohol Use: No    Review of Systems As in history of present illness Allergies  Review of patient's allergies indicates no known allergies.  Home Medications   Prior to Admission medications   Medication Sig Start Date End Date Taking? Authorizing Provider  hydrOXYzine (ATARAX/VISTARIL) 25 MG tablet Take 1 tablet (25 mg total) by mouth every 6 (six) hours as needed for itching. 08/23/15   Charm RingsErin J Kellsie Grindle, MD  triamcinolone cream (KENALOG) 0.1 % Apply 1 application topically 2 (two) times daily as needed. For itching 08/23/15   Charm RingsErin J Laree Garron, MD   Meds Ordered and Administered this Visit  Medications - No data to display  BP 127/79 mmHg  Pulse 57  Temp(Src) 98.3 F (36.8 C) (Oral)  Resp 12  SpO2 100% No  data found.   Physical Exam  Constitutional: He is oriented to person, place, and time. He appears well-developed and well-nourished. No distress.  Cardiovascular: Normal rate.   Pulmonary/Chest: Effort normal.  Neurological: He is alert and oriented to person, place, and time.  Skin:  He has some dry skin over the right knee. There are also for erythematous papules on the right anterior thigh.    ED Course  Procedures (including critical care time)  Labs Review Labs Reviewed - No data to display  Imaging Review No results found.    MDM   1. Itching    Given that this only affects the lower extremities, he could have exposure to fleas. No papules or burrows to suggest scabies.  No eczematous type changes. We'll treat symptomatically with triamcinolone cream and hydroxyzine as needed. Also recommended that he get Zyrtec over-the-counter and take that daily. Follow-up as needed.    Charm RingsErin J Ikenna Ohms, MD 08/23/15 [phone]

## 2015-08-23 NOTE — ED Notes (Signed)
Moved into current location one month ago.  Over the past 3 days has had itching and feels like insects are biting him.  No noticeable bumps

## 2015-08-23 NOTE — Discharge Instructions (Signed)
Something is irritating your skin, causing it to itch. It may be related to the pets or possible fleas in the home. Make sure all pets or treated appropriately for fleas. Take Zyrtec or cetirizine daily to see if that'll help with the itching. You can use triamcinolone cream twice a day as needed for itching. Use the hydroxyzine every 6 hours as needed for itching. Follow-up as needed.

## 2016-08-17 ENCOUNTER — Ambulatory Visit (HOSPITAL_COMMUNITY)
Admission: EM | Admit: 2016-08-17 | Discharge: 2016-08-17 | Disposition: A | Discharge status: Home / Self Care | Payer: Self-pay | Attending: Family Medicine | Admitting: Family Medicine

## 2016-08-17 ENCOUNTER — Encounter (HOSPITAL_COMMUNITY): Payer: Self-pay | Admitting: Family Medicine

## 2016-08-17 DIAGNOSIS — A749 Chlamydial infection, unspecified: Secondary | ICD-10-CM | POA: Insufficient documentation

## 2016-08-17 DIAGNOSIS — R300 Dysuria: Secondary | ICD-10-CM | POA: Insufficient documentation

## 2016-08-17 DIAGNOSIS — Z87440 Personal history of urinary (tract) infections: Secondary | ICD-10-CM | POA: Insufficient documentation

## 2016-08-17 DIAGNOSIS — F17290 Nicotine dependence, other tobacco product, uncomplicated: Secondary | ICD-10-CM | POA: Insufficient documentation

## 2016-08-17 DIAGNOSIS — Z711 Person with feared health complaint in whom no diagnosis is made: Secondary | ICD-10-CM

## 2016-08-17 MED ORDER — AZITHROMYCIN 250 MG PO TABS
1000.0000 mg | ORAL_TABLET | Freq: Once | ORAL | Status: AC
  Administered 2016-08-17: 1000 mg via ORAL

## 2016-08-17 MED ORDER — CEFTRIAXONE SODIUM 250 MG IJ SOLR
250.0000 mg | Freq: Once | INTRAMUSCULAR | Status: AC
  Administered 2016-08-17: 250 mg via INTRAMUSCULAR

## 2016-08-17 MED ORDER — AZITHROMYCIN 250 MG PO TABS
ORAL_TABLET | ORAL | Status: AC
  Filled 2016-08-17: qty 1

## 2016-08-17 MED ORDER — CEFTRIAXONE SODIUM 250 MG IJ SOLR
INTRAMUSCULAR | Status: AC
  Filled 2016-08-17: qty 250

## 2016-08-17 NOTE — ED Provider Notes (Signed)
[facility]    CSN: [id number] Arrival date & time: 08/17/16  1213     History   Chief Complaint Chief Complaint  Patient presents with  . Dysuria    HPI Tyler Santiago is a 25 y.o. male.   The history is provided by the patient.  Dysuria  This is a new problem. The current episode started more than 2 days ago (sx onset mon, told 2d ago by girl that she had strayed.). The problem has been gradually worsening. Pertinent negatives include no abdominal pain.    Past Medical History:  Diagnosis Date  . Chlamydia   . UTI (lower urinary tract infection)     There are no active problems to display for this patient.   History reviewed. No pertinent surgical history.     Home Medications    Prior to Admission medications   Medication Sig Start Date End Date Taking? Authorizing Provider  hydrOXYzine (ATARAX/VISTARIL) 25 MG tablet Take 1 tablet (25 mg total) by mouth every 6 (six) hours as needed for itching. 08/23/15   Charm Rings, MD  triamcinolone cream (KENALOG) 0.1 % Apply 1 application topically 2 (two) times daily as needed. For itching 08/23/15   Charm Rings, MD    Family History History reviewed. No pertinent family history.  Social History Social History  Substance Use Topics  . Smoking status: Current Some Day Smoker    Types: Cigars  . Smokeless tobacco: Never Used  . Alcohol use No     Allergies   Review of patient's allergies indicates no known allergies.   Review of Systems Review of Systems  Constitutional: Negative for fever.  Gastrointestinal: Negative.  Negative for abdominal pain.  Genitourinary: Positive for dysuria. Negative for difficulty urinating, discharge, flank pain, penile pain, penile swelling, scrotal swelling and testicular pain.  All other systems reviewed and are negative.    Physical Exam Triage Vital Signs ED Triage Vitals  Enc Vitals Group     BP 08/17/16 1333 125/82     Pulse Rate 08/17/16 1333 70    Resp 08/17/16 1333 16     Temp 08/17/16 1333 98.2 F (36.8 C)     Temp Source 08/17/16 1333 Oral     SpO2 08/17/16 1333 99 %     Weight --      Height --      Head Circumference --      Peak Flow --      Pain Score 08/17/16 1336 7     Pain Loc --      Pain Edu? --      Excl. in GC? --    No data found.   Updated Vital Signs BP 125/82 (BP Location: Right Arm)   Pulse 70   Temp 98.2 F (36.8 C) (Oral)   Resp 16   SpO2 99%   Visual Acuity Right Eye Distance:   Left Eye Distance:   Bilateral Distance:    Right Eye Near:   Left Eye Near:    Bilateral Near:     Physical Exam  Constitutional: He is oriented to person, place, and time. He appears well-developed and well-nourished.  Abdominal: Soft. Bowel sounds are normal.  Genitourinary: Penis normal.  Neurological: He is alert and oriented to person, place, and time.  Skin: Skin is warm and dry.  Nursing note and vitals reviewed.    UC Treatments / Results  Labs (all labs ordered are listed, but only abnormal results are  displayed) Labs Reviewed - No data to display  EKG  EKG Interpretation None       Radiology No results found.  Procedures Procedures (including critical care time)  Medications Ordered in UC Medications  azithromycin (ZITHROMAX) tablet 1,000 mg (not administered)  cefTRIAXone (ROCEPHIN) injection 250 mg (not administered)     Initial Impression / Assessment and Plan / UC Course  I have reviewed the triage vital signs and the nursing notes.  Pertinent labs & imaging results that were available during my care of the patient were reviewed by me and considered in my medical decision making (see chart for details).  Clinical Course      Final Clinical Impressions(s) / UC Diagnoses   Final diagnoses:  None    New Prescriptions New Prescriptions   No medications on file     Linna HoffJames D Jagar Lua, MD 08/17/16 1404

## 2016-08-17 NOTE — ED Triage Notes (Signed)
Pt here with intermittent burning with urination. sts his girlfriend told him she had an STD. Denies any discharge.

## 2016-08-17 NOTE — Discharge Instructions (Signed)
We will call with positive test results and treat as indicated  °

## 2016-08-18 LAB — HIV ANTIBODY (ROUTINE TESTING W REFLEX): HIV Screen 4th Generation wRfx: NONREACTIVE

## 2016-08-18 LAB — RPR: RPR: NONREACTIVE

## 2017-01-25 ENCOUNTER — Ambulatory Visit (HOSPITAL_COMMUNITY)
Admission: EM | Admit: 2017-01-25 | Discharge: 2017-01-25 | Disposition: A | Discharge status: Home / Self Care | Payer: Self-pay | Attending: Internal Medicine | Admitting: Internal Medicine

## 2017-01-25 ENCOUNTER — Encounter (HOSPITAL_COMMUNITY): Payer: Self-pay | Admitting: Emergency Medicine

## 2017-01-25 DIAGNOSIS — Z113 Encounter for screening for infections with a predominantly sexual mode of transmission: Secondary | ICD-10-CM

## 2017-01-25 DIAGNOSIS — R369 Urethral discharge, unspecified: Secondary | ICD-10-CM

## 2017-01-25 DIAGNOSIS — F17210 Nicotine dependence, cigarettes, uncomplicated: Secondary | ICD-10-CM | POA: Insufficient documentation

## 2017-01-25 DIAGNOSIS — A749 Chlamydial infection, unspecified: Secondary | ICD-10-CM | POA: Insufficient documentation

## 2017-01-25 DIAGNOSIS — Z87440 Personal history of urinary (tract) infections: Secondary | ICD-10-CM | POA: Insufficient documentation

## 2017-01-25 DIAGNOSIS — A64 Unspecified sexually transmitted disease: Secondary | ICD-10-CM | POA: Insufficient documentation

## 2017-01-25 DIAGNOSIS — R300 Dysuria: Secondary | ICD-10-CM

## 2017-01-25 LAB — POCT URINALYSIS DIP (DEVICE)
BILIRUBIN URINE: NEGATIVE
GLUCOSE, UA: NEGATIVE mg/dL
KETONES UR: NEGATIVE mg/dL
Leukocytes, UA: NEGATIVE
Nitrite: NEGATIVE
PROTEIN: NEGATIVE mg/dL
Specific Gravity, Urine: >=1.03 (ref 1.005–1.030)
Urobilinogen, UA: 0.2 mg/dL (ref 0.0–1.0)
pH: 6 (ref 5.0–8.0)

## 2017-01-25 MED ORDER — AZITHROMYCIN 250 MG PO TABS
1000.0000 mg | ORAL_TABLET | Freq: Once | ORAL | Status: AC
  Administered 2017-01-25: 1000 mg via ORAL

## 2017-01-25 MED ORDER — AZITHROMYCIN 250 MG PO TABS
ORAL_TABLET | ORAL | Status: AC
  Filled 2017-01-25: qty 4

## 2017-01-25 MED ORDER — CEFTRIAXONE SODIUM 250 MG IJ SOLR
250.0000 mg | Freq: Once | INTRAMUSCULAR | Status: AC
  Administered 2017-01-25: 250 mg via INTRAMUSCULAR

## 2017-01-25 MED ORDER — CEFTRIAXONE SODIUM 250 MG IJ SOLR
INTRAMUSCULAR | Status: AC
  Filled 2017-01-25: qty 250

## 2017-01-25 NOTE — ED Triage Notes (Signed)
Pt reports urinary pain during and after using restroom since Tuesday.

## 2017-01-25 NOTE — ED Provider Notes (Signed)
CSN: [id number]     Arrival date & time 01/25/17  1238 History   First MD Initiated Contact with Patient 01/25/17 1412     Chief Complaint  Patient presents with  . SEXUALLY TRANSMITTED DISEASE   (Consider location/radiation/quality/duration/timing/severity/associated sxs/prior Treatment)  Patient is a healthy 26 year old male, presents today for painful urination since Tuesday (4 days) along with some penile discharge that is cloudy in appearance. Patient is sexually active with 1 partner. Sex is unprotected. He denies penile rash or inflammation. He also denies testicular pain or swelling. Patient have had history of STI in the past. Patient would like to go ahead and get treated for STD.         Past Medical History:  Diagnosis Date  . Chlamydia   . UTI (lower urinary tract infection)    History reviewed. No pertinent surgical history. No family history on file. Social History  Substance Use Topics  . Smoking status: Current Every Day Smoker    Packs/day: 0.25    Types: Cigars, Cigarettes  . Smokeless tobacco: Never Used  . Alcohol use Yes     Comment: occasionally    Review of Systems  Constitutional:       As stated in history of present illness    Allergies  Patient has no known allergies.  Home Medications   Prior to Admission medications   Medication Sig Start Date End Date Taking? Authorizing Provider  hydrOXYzine (ATARAX/VISTARIL) 25 MG tablet Take 1 tablet (25 mg total) by mouth every 6 (six) hours as needed for itching. 08/23/15   Charm Rings, MD  triamcinolone cream (KENALOG) 0.1 % Apply 1 application topically 2 (two) times daily as needed. For itching 08/23/15   Charm Rings, MD   Meds Ordered and Administered this Visit   Medications  cefTRIAXone (ROCEPHIN) injection 250 mg (not administered)  azithromycin (ZITHROMAX) tablet 1,000 mg (not administered)    BP 132/65   Pulse 68   Temp 98.7 F (37.1 C) (Oral)   Resp 16   Ht  (1.676 m)    Wt 130 lb (59 kg)   SpO2 100%   BMI 20.98 kg/m  No data found.   Physical Exam  Constitutional: He is oriented to person, place, and time. He appears well-developed and well-nourished.  Cardiovascular: Normal rate and regular rhythm.   Pulmonary/Chest: Effort normal and breath sounds normal.  Abdominal: Soft. Bowel sounds are normal.  Genitourinary:  Genitourinary Comments: Declines penile exam  Neurological: He is alert and oriented to person, place, and time.  Nursing note and vitals reviewed.   Urgent Care Course     Procedures (including critical care time)  Labs Review Labs Reviewed  POCT URINALYSIS DIP (DEVICE) - Abnormal; Notable for the following:       Result Value   Hgb urine dipstick MODERATE (*)    All other components within normal limits  URINE CULTURE  URINE CYTOLOGY ANCILLARY ONLY    Imaging Review No results found.   MDM   1. Penile discharge    UA is negative for leukocyte and nitrite. UA does have a moderate amount of hemoglobin. Doubt UTI but urine culture is pending. Informed that if urine culture is negative, then he needs to f/u with PCP in 1-2 weeks for UA to be repeated.   I am more concerned for STD. Patient is treated presumptively for gonorrhea and chlamydia with azithromycin 1000 mg and Rocephin 250 mg in the office  Patient informed that  we will call him with the results of his urine culture and urine cytology   Patient denies any questions.     Lucia Estelle, NP 01/25/17 1434

## 2017-01-25 NOTE — Discharge Instructions (Addendum)
You have been treated for gonorrhea and chlamydia today. We will call you when the result comes back.   I don't suspect a UTI today, however there is blood in your urine, I will send your urine off for urine culture. We will let you know what the urine culture shows when it comes back.   If the urine culture comes back negative for UTI, then I would suggest you to follow up with your regular doctor in 1-2 weeks for urine to be repeated to make sure the blood in your urine has resolved.

## 2017-01-26 LAB — URINE CULTURE: Culture: NO GROWTH

## 2017-01-27 LAB — URINE CYTOLOGY ANCILLARY ONLY
Chlamydia: POSITIVE — AB
NEISSERIA GONORRHEA: NEGATIVE
TRICH (WINDOWPATH): NEGATIVE

## 2017-09-20 ENCOUNTER — Encounter (HOSPITAL_COMMUNITY): Payer: Self-pay | Admitting: Emergency Medicine

## 2017-09-20 ENCOUNTER — Other Ambulatory Visit: Payer: Self-pay

## 2017-09-20 ENCOUNTER — Ambulatory Visit (HOSPITAL_COMMUNITY)
Admission: EM | Admit: 2017-09-20 | Discharge: 2017-09-20 | Disposition: A | Discharge status: Home / Self Care | Payer: Self-pay | Attending: Emergency Medicine | Admitting: Emergency Medicine

## 2017-09-20 DIAGNOSIS — Z113 Encounter for screening for infections with a predominantly sexual mode of transmission: Secondary | ICD-10-CM

## 2017-09-20 DIAGNOSIS — Z202 Contact with and (suspected) exposure to infections with a predominantly sexual mode of transmission: Secondary | ICD-10-CM

## 2017-09-20 DIAGNOSIS — F17210 Nicotine dependence, cigarettes, uncomplicated: Secondary | ICD-10-CM | POA: Insufficient documentation

## 2017-09-20 DIAGNOSIS — Z7251 High risk heterosexual behavior: Secondary | ICD-10-CM | POA: Insufficient documentation

## 2017-09-20 DIAGNOSIS — R319 Hematuria, unspecified: Secondary | ICD-10-CM | POA: Insufficient documentation

## 2017-09-20 LAB — POCT URINALYSIS DIP (DEVICE)
BILIRUBIN URINE: NEGATIVE
GLUCOSE, UA: NEGATIVE mg/dL
KETONES UR: NEGATIVE mg/dL
Nitrite: NEGATIVE
Protein, ur: NEGATIVE mg/dL
SPECIFIC GRAVITY, URINE: 1.02 (ref 1.005–1.030)
Urobilinogen, UA: 0.2 mg/dL (ref 0.0–1.0)
pH: 7 (ref 5.0–8.0)

## 2017-09-20 MED ORDER — AZITHROMYCIN 250 MG PO TABS
1000.0000 mg | ORAL_TABLET | Freq: Once | ORAL | Status: AC
  Administered 2017-09-20: 1000 mg via ORAL

## 2017-09-20 MED ORDER — AZITHROMYCIN 250 MG PO TABS
ORAL_TABLET | ORAL | Status: AC
  Filled 2017-09-20: qty 4

## 2017-09-20 MED ORDER — CEFTRIAXONE SODIUM 250 MG IJ SOLR
250.0000 mg | Freq: Once | INTRAMUSCULAR | Status: AC
  Administered 2017-09-20: 250 mg via INTRAMUSCULAR

## 2017-09-20 MED ORDER — LIDOCAINE HCL (PF) 1 % IJ SOLN
INTRAMUSCULAR | Status: AC
  Filled 2017-09-20: qty 2

## 2017-09-20 MED ORDER — CEFTRIAXONE SODIUM 250 MG IJ SOLR
INTRAMUSCULAR | Status: AC
  Filled 2017-09-20: qty 250

## 2017-09-20 NOTE — ED Notes (Signed)
Sent to bathroom for a dirty and clean specimen

## 2017-09-20 NOTE — ED Notes (Signed)
Clean & dirty urine specimens in lab 

## 2017-09-20 NOTE — ED Triage Notes (Signed)
Blood in urine, pain with urination-noticed Thursday.  Denies penile discharge

## 2017-09-20 NOTE — ED Provider Notes (Signed)
[facility]    CSN: [id number] Arrival date & time: 09/20/17  1245     History   Chief Complaint Chief Complaint  Patient presents with  . Hematuria    HPI Tyler Santiago is a 26 y.o. male.   HPI   Complaints of hematuria on and off since this past Wednesday.  Patient states that he "wants to be checked".  Patient states he is in a monogamous relationship for the past 5 months and has not been using safe sex practices.  Denies significant medical history  Past Medical History:  Diagnosis Date  . Chlamydia   . UTI (lower urinary tract infection)     There are no active problems to display for this patient.   History reviewed. No pertinent surgical history.     Home Medications    Prior to Admission medications   Medication Sig Start Date End Date Taking? Authorizing Provider  hydrOXYzine (ATARAX/VISTARIL) 25 MG tablet Take 1 tablet (25 mg total) by mouth every 6 (six) hours as needed for itching. 08/23/15   Charm RingsHonig, Erin J, MD  triamcinolone cream (KENALOG) 0.1 % Apply 1 application topically 2 (two) times daily as needed. For itching 08/23/15   Charm RingsHonig, Erin J, MD    Family History Family History  Problem Relation Age of Onset  . Healthy Mother     Social History Social History   Tobacco Use  . Smoking status: Current Every Day Smoker    Packs/day: 0.25    Types: Cigars, Cigarettes  . Smokeless tobacco: Never Used  Substance Use Topics  . Alcohol use: Yes    Comment: occasionally  . Drug use: No     Allergies   Patient has no known allergies.   Review of Systems Review of Systems  Constitutional: Negative.  Negative for fatigue and fever.  HENT: Negative.   Eyes: Negative.   Respiratory: Negative.  Negative for cough and shortness of breath.   Cardiovascular: Negative.   Gastrointestinal: Negative.  Negative for diarrhea, nausea and vomiting.  Endocrine: Negative.   Genitourinary: Positive for hematuria. Negative for decreased  urine volume, difficulty urinating, discharge, flank pain, frequency, genital sores, penile pain, penile swelling, scrotal swelling, testicular pain and urgency.  Musculoskeletal: Negative.   Skin: Negative.   Allergic/Immunologic: Negative.   Neurological: Negative.  Negative for headaches.  Hematological: Negative.   Psychiatric/Behavioral: Negative.    The patient denies any generalized malaise, fever, chills, eye problems, sore throat, skin rash or joint pain.  Denies abdominal pain, nausea, or vomiting. In addition, the patient denies any dysuria, burning, testicular pain or swelling, lesions of the penis or penile discharge.   Physical Exam Triage Vital Signs ED Triage Vitals  Enc Vitals Group     BP 09/20/17 1348 129/73     Pulse Rate 09/20/17 1348 76     Resp --      Temp 09/20/17 1348 98.9 F (37.2 C)     Temp Source 09/20/17 1348 Oral     SpO2 09/20/17 1348 100 %     Weight --      Height --      Head Circumference --      Peak Flow --      Pain Score 09/20/17 1350 8     Pain Loc --      Pain Edu? --      Excl. in GC? --    No data found.  Updated Vital Signs BP 129/73 (BP Location: Right  Arm)   Pulse 76   Temp 98.9 F (37.2 C) (Oral)   SpO2 100%      Physical Exam  Constitutional: He appears well-developed and well-nourished. No distress.  Cardiovascular: Normal rate, regular rhythm, normal heart sounds and intact distal pulses. Exam reveals no gallop and no friction rub.  No murmur heard. Pulmonary/Chest: Effort normal and breath sounds normal. No respiratory distress. He has no wheezes. He has no rales. He exhibits no tenderness.  Abdominal: Soft. Bowel sounds are normal. He exhibits no distension and no mass. There is no tenderness. There is no rebound and no guarding. No hernia.  No tenderness noted with bladder palpation.  Genitourinary:  Genitourinary Comments:  The patient was instructed to inform all sexual contacts, avoid intercourse completely  for 2 weeks and then only with a condom. The patient advised that we would call about all abnormal lab results, and that we would need to report certain kinds of infection to the health department.  The patient advised to follow up here if no better in 3 to 4 days, or sooner if becoming worse in any way, and given some red flag symptoms such as fever, pain, or difficulty urinating which would prompt immediate return. The patient verbalizes understanding and agrees to plan of care.     Skin: Skin is warm and dry. He is not diaphoretic.  Nursing note and vitals reviewed.    UC Treatments / Results  Labs (all labs ordered are listed, but only abnormal results are displayed) Labs Reviewed  POCT URINALYSIS DIP (DEVICE) - Abnormal; Notable for the following components:      Result Value   Hgb urine dipstick TRACE (*)    Leukocytes, UA TRACE (*)    All other components within normal limits  URINE CULTURE  URINE CYTOLOGY ANCILLARY ONLY   STI testing and Urine culture pending.    EKG  EKG Interpretation None       Radiology No results found.  Procedures Procedures (including critical care time)  Medications Ordered in UC Medications  cefTRIAXone (ROCEPHIN) injection 250 mg (250 mg Intramuscular Given 09/20/17 1513)  azithromycin (ZITHROMAX) tablet 1,000 mg (1,000 mg Oral Given 09/20/17 1511)     Initial Impression / Assessment and Plan / UC Course  I have reviewed the triage vital signs and the nursing notes.  Pertinent labs & imaging results that were available during my care of the patient were reviewed by me and considered in my medical decision making (see chart for details).    Discussed with patient the likelihood that this is a sexually transmitted infection.  Patient agrees to plan of care and follow-up as indicated.  Final Clinical Impressions(s) / UC Diagnoses   Final diagnoses:  Hematuria, unspecified type  High risk sexual behavior, unspecified type    ED  Discharge Orders    None       Controlled Substance Prescriptions [place] Controlled Substance Registry consulted? Not Applicable   The usual and customary discharge instructions and warnings were given.  The patient verbalizes understanding and agrees to plan of care.     Servando Salina.   Rossi, Catherine H, NP 09/20/17 [phone]

## 2017-09-20 NOTE — Discharge Instructions (Signed)
Feel free to return here as needed.  We will call you in a few days if test results are positive.

## 2017-09-21 LAB — URINE CULTURE
Culture: NO GROWTH
SPECIAL REQUESTS: NORMAL

## 2017-09-22 LAB — URINE CYTOLOGY ANCILLARY ONLY
CHLAMYDIA, DNA PROBE: NEGATIVE
Neisseria Gonorrhea: POSITIVE — AB
Trichomonas: NEGATIVE

## 2017-09-24 LAB — URINE CYTOLOGY ANCILLARY ONLY: Candida vaginitis: NEGATIVE

## 2023-12-13 ENCOUNTER — Ambulatory Visit
Admission: EM | Admit: 2023-12-13 | Discharge: 2023-12-13 | Disposition: A | Discharge status: Home / Self Care | Payer: Self-pay | Attending: Family Medicine | Admitting: Family Medicine

## 2023-12-13 DIAGNOSIS — H6123 Impacted cerumen, bilateral: Secondary | ICD-10-CM

## 2023-12-13 MED ORDER — CEFDINIR 300 MG PO CAPS: 300.0000 mg | ORAL_CAPSULE | Freq: Two times a day (BID) | ORAL | 0 refills | Status: AC

## 2023-12-13 MED ORDER — PSEUDOEPHEDRINE HCL 60 MG PO TABS: 60.0000 mg | ORAL_TABLET | Freq: Three times a day (TID) | ORAL | 0 refills | Status: AC | PRN

## 2023-12-13 MED ORDER — CETIRIZINE HCL 10 MG PO TABS: 10.0000 mg | ORAL_TABLET | Freq: Every day | ORAL | 0 refills | Status: AC

## 2023-12-13 MED ORDER — IBUPROFEN 600 MG PO TABS: 600.0000 mg | ORAL_TABLET | Freq: Four times a day (QID) | ORAL | 0 refills | Status: AC | PRN

## 2023-12-13 NOTE — ED Provider Notes (Signed)
 [facility]  Note:  This document was prepared using Conservation officer, historic buildings and may include unintentional dictation errors.  MRN: [id number] DOB: Nov 08, 1990  Subjective:   Tyler Santiago is a 33 y.o. male presenting for 1 day history of tinnitus of the right ear, decreased hearing, feeling right ear fullness.  No drainage, fever.  Patient did have a cold last week.  This has since resolved.  No current facility-administered medications for this encounter.  Current Outpatient Medications:    hydrOXYzine (ATARAX/VISTARIL) 25 MG tablet, Take 1 tablet (25 mg total) by mouth every 6 (six) hours as needed for itching., Disp: 30 tablet, Rfl: 0   triamcinolone cream (KENALOG) 0.1 %, Apply 1 application topically 2 (two) times daily as needed. For itching, Disp: 30 g, Rfl: 0   No Known Allergies  Past Medical History:  Diagnosis Date   Chlamydia    UTI (lower urinary tract infection)      No past surgical history on file.  Family History  Problem Relation Age of Onset   Healthy Mother     Social History   Tobacco Use   Smoking status: Every Day    Current packs/day: 0.25    Types: Cigars, Cigarettes   Smokeless tobacco: Never  Substance Use Topics   Alcohol use: Yes    Comment: occasionally   Drug use: No    ROS   Objective:   Vitals: There were no vitals taken for this visit.  Physical Exam Constitutional:      General: He is not in acute distress.    Appearance: Normal appearance. He is well-developed and normal weight. He is not ill-appearing, toxic-appearing or diaphoretic.  HENT:     Head: Normocephalic and atraumatic.     Right Ear: Tympanic membrane, ear canal and external ear normal. There is impacted cerumen.     Left Ear: Tympanic membrane, ear canal and external ear normal. There is impacted cerumen.     Nose: Nose normal.     Mouth/Throat:     Pharynx: Oropharynx is clear.  Eyes:     General: No scleral icterus.        Right eye: No discharge.        Left eye: No discharge.     Extraocular Movements: Extraocular movements intact.  Cardiovascular:     Rate and Rhythm: Normal rate.  Pulmonary:     Effort: Pulmonary effort is normal.  Musculoskeletal:     Cervical back: Normal range of motion.  Neurological:     Mental Status: He is alert and oriented to person, place, and time.  Psychiatric:        Mood and Affect: Mood normal.        Behavior: Behavior normal.        Thought Content: Thought content normal.        Judgment: Judgment normal.    Ear lavage performed using mixture of peroxide and water.  Pressure irrigation performed using a bottle and a thin ear tube.  Bilateral ear lavage.  No curette was used.   Assessment and Plan :   PDMP not reviewed this encounter.  1. Bilateral impacted cerumen    Successful bilateral ear lavage.  General management of cerumen impaction reviewed with patient.  Anticipatory guidance provided.  I did offer patient a prescription for cefdinir to fill in the event that he continues to have right ear pain or fevers.  Counseled patient on potential for adverse effects  with medications prescribed/recommended today, ER and return-to-clinic precautions discussed, patient verbalized understanding.    Wallis Bamberg, [place] 12/13/23 1319

## 2023-12-13 NOTE — ED Triage Notes (Signed)
 Pt c/o right ear ringing, muffled "feels like water in it and inflamed-eustachian tube dysfunction" started last night-NAD-steady gait
